# Patient Record
Sex: Male | Born: 2000 | Race: White | Hispanic: No | Marital: Single | Smoking: Never smoker
Health system: Southern US, Community
[De-identification: ages and names within clinical notes are randomized; demographics above are authoritative.]

---
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
## 2019-11-02 ENCOUNTER — Other Ambulatory Visit: Payer: Self-pay | Admitting: Neurological Surgery

## 2019-11-16 ENCOUNTER — Other Ambulatory Visit: Payer: Self-pay | Admitting: Neurological Surgery

## 2019-11-21 ENCOUNTER — Encounter (HOSPITAL_COMMUNITY)
Admission: RE | Admit: 2019-11-21 | Discharge: 2019-11-21 | Disposition: A | Discharge status: Home / Self Care | Payer: Medicaid Other | Source: Ambulatory Visit | Attending: Neurological Surgery | Admitting: Neurological Surgery

## 2019-11-21 ENCOUNTER — Other Ambulatory Visit: Payer: Self-pay | Admitting: Neurological Surgery

## 2019-11-21 ENCOUNTER — Other Ambulatory Visit: Payer: Self-pay

## 2019-11-21 ENCOUNTER — Encounter (HOSPITAL_COMMUNITY): Payer: Self-pay

## 2019-11-21 ENCOUNTER — Other Ambulatory Visit (HOSPITAL_COMMUNITY)
Admission: RE | Admit: 2019-11-21 | Discharge: 2019-11-21 | Disposition: A | Discharge status: Home / Self Care | Payer: Medicaid Other | Source: Ambulatory Visit | Attending: Neurological Surgery | Admitting: Neurological Surgery

## 2019-11-21 DIAGNOSIS — Z20822 Contact with and (suspected) exposure to covid-19: Secondary | ICD-10-CM | POA: Diagnosis not present

## 2019-11-21 DIAGNOSIS — Z01812 Encounter for preprocedural laboratory examination: Secondary | ICD-10-CM | POA: Insufficient documentation

## 2019-11-21 LAB — CBC
HCT: 47.2 % (ref 39.0–52.0)
Hemoglobin: 15.3 g/dL (ref 13.0–17.0)
MCH: 28 pg (ref 26.0–34.0)
MCHC: 32.4 g/dL (ref 30.0–36.0)
MCV: 86.3 fL (ref 80.0–100.0)
Platelets: 269 10*3/uL (ref 150–400)
RBC: 5.47 MIL/uL (ref 4.22–5.81)
RDW: 11.7 % (ref 11.5–15.5)
WBC: 5.3 10*3/uL (ref 4.0–10.5)
nRBC: 0 % (ref 0.0–0.2)

## 2019-11-21 LAB — SARS CORONAVIRUS 2 (TAT 6-24 HRS): SARS Coronavirus 2: NEGATIVE

## 2019-11-21 NOTE — Progress Notes (Signed)
[id number] [place], [place] - [address] AT [facility] [address] [place] [address] Phone: [phone] Fax: [phone]      Your procedure is scheduled on September 23  Report to [facility] Main Entrance "A" at 0530 A.M., and check in at the Admitting office.  Call this number if you have problems the morning of surgery:  [phone]  Call [phone] if you have any questions prior to your surgery date Monday-Friday 8am-4pm    Remember:  Do not eat or drink after midnight the night before your surgery    There are NO medications that you need to take the morning of surgery  As of today, STOP taking any Aspirin (unless otherwise instructed by your surgeon) Aleve, Naproxen, Ibuprofen, Motrin, Advil, Goody's, BC's, all herbal medications, fish oil, and all vitamins.                      Do not wear jewelry\            Do not wear lotions, powders, colognes, or deodorant.            \Men may shave face and neck.            Do not bring valuables to the hospital.            [facility] is not responsible for any belongings or valuables.  Do NOT Smoke (Tobacco/Vaping) or drink Alcohol 24 hours prior to your procedure If you use a CPAP at night, you may bring all equipment for your overnight stay.   Contacts, glasses, dentures or bridgework may not be worn into surgery.      For patients admitted to the hospital, discharge time will be determined by your treatment team.   Patients discharged the day of surgery will not be allowed to drive home, and someone needs to stay with them for 24 hours.    Special instructions:   [place]- Preparing For Surgery  Before surgery, you can play an important role. Because skin is not sterile, your skin needs to be as free of germs as possible. You can reduce the number of germs on your skin by washing with CHG (chlorahexidine gluconate) Soap before surgery.  CHG is an antiseptic cleaner which kills  germs and bonds with the skin to continue killing germs even after washing.    Oral Hygiene is also important to reduce your risk of infection.  Remember - BRUSH YOUR TEETH THE MORNING OF SURGERY WITH YOUR REGULAR TOOTHPASTE  Please do not use if you have an allergy to CHG or antibacterial soaps. If your skin becomes reddened/irritated stop using the CHG.  Do not shave (including legs and underarms) for at least 48 hours prior to first CHG shower. It is OK to shave your face.  Please follow these instructions carefully.   1. Shower the NIGHT BEFORE SURGERY and the MORNING OF SURGERY with CHG Soap.   2. If you chose to wash your hair, wash your hair first as usual with your normal shampoo.  3. After you shampoo, rinse your hair and body thoroughly to remove the shampoo.  4. Use CHG as you would any other liquid soap. You can apply CHG directly to the skin and wash gently with a scrungie or a clean washcloth.   5. Apply the CHG Soap to your body ONLY FROM THE NECK DOWN.  Do not use on open wounds or open sores. Avoid  contact with your eyes, ears, mouth and genitals (private parts). Wash Face and genitals (private parts)  with your normal soap.   6. Wash thoroughly, paying special attention to the area where your surgery will be performed.  7. Thoroughly rinse your body with warm water from the neck down.  8. DO NOT shower/wash with your normal soap after using and rinsing off the CHG Soap.  9. Pat yourself dry with a CLEAN TOWEL.  10. Wear CLEAN PAJAMAS to bed the night before surgery  11. Place CLEAN SHEETS on your bed the night of your first shower and DO NOT SLEEP WITH PETS.   Day of Surgery: Wear Clean/Comfortable clothing the morning of surgery Do not apply any deodorants/lotions.   Remember to brush your teeth WITH YOUR REGULAR TOOTHPASTE.   Please read over the following fact sheets that you were given.

## 2019-11-21 NOTE — Progress Notes (Signed)
PCP - Kaleen Mask   Chest x-ray - n/a EKG - n/a Stress Test - n/a ECHO - n/a Cardiac Cath - n/a  COVID TEST- 11/21/19   Anesthesia review: NO  Patient denies shortness of breath, fever, cough and chest pain at PAT appointment   All instructions explained to the patient, with a verbal understanding of the material. Patient agrees to go over the instructions while at home for a better understanding. Patient also instructed to self quarantine after being tested for COVID-19. The opportunity to ask questions was provided.

## 2019-11-23 LAB — NASOPHARYNGEAL CULTURE: Culture: NORMAL

## 2019-11-23 NOTE — Anesthesia Preprocedure Evaluation (Addendum)
Anesthesia Evaluation  Patient identified by MRN, date of birth, ID band Patient awake    Reviewed: Allergy & Precautions, H&P , NPO status , Patient's Chart, lab work & pertinent test results  Airway Mallampati: I  TM Distance: >3 FB Neck ROM: Full    Dental no notable dental hx. (+) Teeth Intact, Dental Advisory Given   Pulmonary  Patient states he vapes daily.    Pulmonary exam normal breath sounds clear to auscultation       Cardiovascular Exercise Tolerance: Good negative cardio ROS   Rhythm:Regular Rate:Normal     Neuro/Psych negative neurological ROS  negative psych ROS   GI/Hepatic negative GI ROS, Neg liver ROS,   Endo/Other  negative endocrine ROS  Renal/GU negative Renal ROS  negative genitourinary   Musculoskeletal   Abdominal   Peds  Hematology negative hematology ROS (+)   Anesthesia Other Findings   Reproductive/Obstetrics negative OB ROS                          Anesthesia Physical Anesthesia Plan  ASA: II  Anesthesia Plan: General   Post-op Pain Management:    Induction: Intravenous  PONV Risk Score and Plan: 3 and Ondansetron, Dexamethasone and Midazolam  Airway Management Planned: Oral ETT  Additional Equipment:   Intra-op Plan:   Post-operative Plan: Extubation in OR  Informed Consent: I have reviewed the patients History and Physical, chart, labs and discussed the procedure including the risks, benefits and alternatives for the proposed anesthesia with the patient or authorized representative who has indicated his/her understanding and acceptance.     Dental advisory given  Plan Discussed with: CRNA  Anesthesia Plan Comments:       Anesthesia Quick Evaluation

## 2019-11-24 ENCOUNTER — Encounter (HOSPITAL_COMMUNITY): Payer: Self-pay | Admitting: Neurological Surgery

## 2019-11-24 ENCOUNTER — Ambulatory Visit (HOSPITAL_COMMUNITY): Payer: Medicaid Other

## 2019-11-24 ENCOUNTER — Ambulatory Visit (HOSPITAL_COMMUNITY): Payer: Medicaid Other | Admitting: Anesthesiology

## 2019-11-24 ENCOUNTER — Encounter (HOSPITAL_COMMUNITY)
Admission: RE | Disposition: A | Discharge status: Home / Self Care | Payer: Self-pay | Source: Home / Self Care | Attending: Neurological Surgery

## 2019-11-24 ENCOUNTER — Ambulatory Visit (HOSPITAL_COMMUNITY)
Admission: RE | Admit: 2019-11-24 | Discharge: 2019-11-24 | Disposition: A | Discharge status: Home / Self Care | Payer: Medicaid Other | Attending: Neurological Surgery | Admitting: Neurological Surgery

## 2019-11-24 ENCOUNTER — Other Ambulatory Visit: Payer: Self-pay

## 2019-11-24 DIAGNOSIS — M5117 Intervertebral disc disorders with radiculopathy, lumbosacral region: Secondary | ICD-10-CM | POA: Insufficient documentation

## 2019-11-24 DIAGNOSIS — M5126 Other intervertebral disc displacement, lumbar region: Secondary | ICD-10-CM | POA: Diagnosis present

## 2019-11-24 DIAGNOSIS — Z87891 Personal history of nicotine dependence: Secondary | ICD-10-CM | POA: Insufficient documentation

## 2019-11-24 DIAGNOSIS — Z419 Encounter for procedure for purposes other than remedying health state, unspecified: Secondary | ICD-10-CM

## 2019-11-24 HISTORY — PX: LUMBAR LAMINECTOMY/ DECOMPRESSION WITH MET-RX: SHX5959

## 2019-11-24 LAB — PROTIME-INR
INR: 1 (ref 0.8–1.2)
Prothrombin Time: 12.8 seconds (ref 11.4–15.2)

## 2019-11-24 SURGERY — LUMBAR LAMINECTOMY/ DECOMPRESSION WITH MET-RX
Anesthesia: General | Site: Spine Lumbar

## 2019-11-24 MED ORDER — DEXAMETHASONE SODIUM PHOSPHATE 10 MG/ML IJ SOLN
INTRAMUSCULAR | Status: DC | PRN
  Administered 2019-11-24: 10 mg via INTRAVENOUS

## 2019-11-24 MED ORDER — THROMBIN 5000 UNITS EX SOLR
CUTANEOUS | Status: AC
  Filled 2019-11-24: qty 5000

## 2019-11-24 MED ORDER — HYDROCODONE-ACETAMINOPHEN 5-325 MG PO TABS
1.0000 | ORAL_TABLET | Freq: Four times a day (QID) | ORAL | Status: DC | PRN
Start: 2019-11-24 — End: 2019-11-24

## 2019-11-24 MED ORDER — CHLORHEXIDINE GLUCONATE 0.12 % MT SOLN
15.0000 mL | Freq: Once | OROMUCOSAL | Status: AC
  Administered 2019-11-24: 15 mL via OROMUCOSAL
  Filled 2019-11-24: qty 15

## 2019-11-24 MED ORDER — ACETAMINOPHEN 500 MG PO TABS
1000.0000 mg | ORAL_TABLET | Freq: Once | ORAL | Status: AC
  Administered 2019-11-24: 1000 mg via ORAL
  Filled 2019-11-24: qty 2

## 2019-11-24 MED ORDER — THROMBIN 5000 UNITS EX SOLR
OROMUCOSAL | Status: DC | PRN
  Administered 2019-11-24: 5 mL via TOPICAL

## 2019-11-24 MED ORDER — ROCURONIUM BROMIDE 10 MG/ML (PF) SYRINGE
PREFILLED_SYRINGE | INTRAVENOUS | Status: DC | PRN
  Administered 2019-11-24: 20 mg via INTRAVENOUS
  Administered 2019-11-24: 50 mg via INTRAVENOUS

## 2019-11-24 MED ORDER — CHLORHEXIDINE GLUCONATE CLOTH 2 % EX PADS: 6.0000 | MEDICATED_PAD | Freq: Once | CUTANEOUS | Status: DC

## 2019-11-24 MED ORDER — LIDOCAINE 2% (20 MG/ML) 5 ML SYRINGE
INTRAMUSCULAR | Status: AC
  Filled 2019-11-24: qty 5

## 2019-11-24 MED ORDER — BUPIVACAINE-EPINEPHRINE (PF) 0.5% -1:200000 IJ SOLN
INTRAMUSCULAR | Status: DC | PRN
  Administered 2019-11-24: 5 mL

## 2019-11-24 MED ORDER — PROPOFOL 10 MG/ML IV BOLUS
INTRAVENOUS | Status: DC | PRN
  Administered 2019-11-24: 150 mg via INTRAVENOUS

## 2019-11-24 MED ORDER — PHENYLEPHRINE 40 MCG/ML (10ML) SYRINGE FOR IV PUSH (FOR BLOOD PRESSURE SUPPORT)
PREFILLED_SYRINGE | INTRAVENOUS | Status: DC | PRN
  Administered 2019-11-24: 80 ug via INTRAVENOUS

## 2019-11-24 MED ORDER — ONDANSETRON HCL 4 MG/2ML IJ SOLN
INTRAMUSCULAR | Status: DC | PRN
  Administered 2019-11-24: 4 mg via INTRAVENOUS

## 2019-11-24 MED ORDER — FENTANYL CITRATE (PF) 250 MCG/5ML IJ SOLN
INTRAMUSCULAR | Status: AC
  Filled 2019-11-24: qty 5

## 2019-11-24 MED ORDER — HYDROMORPHONE HCL 1 MG/ML IJ SOLN: 0.2500 mg | INTRAMUSCULAR | Status: DC | PRN

## 2019-11-24 MED ORDER — DEXMEDETOMIDINE (PRECEDEX) IN NS 20 MCG/5ML (4 MCG/ML) IV SYRINGE
PREFILLED_SYRINGE | INTRAVENOUS | Status: AC
  Filled 2019-11-24: qty 5

## 2019-11-24 MED ORDER — MIDAZOLAM HCL 2 MG/2ML IJ SOLN
INTRAMUSCULAR | Status: AC
  Filled 2019-11-24: qty 2

## 2019-11-24 MED ORDER — LIDOCAINE-EPINEPHRINE 1 %-1:100000 IJ SOLN
INTRAMUSCULAR | Status: AC
  Filled 2019-11-24: qty 1

## 2019-11-24 MED ORDER — 0.9 % SODIUM CHLORIDE (POUR BTL) OPTIME
TOPICAL | Status: DC | PRN
  Administered 2019-11-24: 1000 mL

## 2019-11-24 MED ORDER — LACTATED RINGERS IV SOLN: INTRAVENOUS | Status: DC

## 2019-11-24 MED ORDER — FENTANYL CITRATE (PF) 100 MCG/2ML IJ SOLN
INTRAMUSCULAR | Status: DC | PRN
Start: 2019-11-24 — End: 2019-11-24
  Administered 2019-11-24: 100 ug via INTRAVENOUS
  Administered 2019-11-24: 50 ug via INTRAVENOUS

## 2019-11-24 MED ORDER — SUGAMMADEX SODIUM 200 MG/2ML IV SOLN
INTRAVENOUS | Status: DC | PRN
  Administered 2019-11-24: 200 mg via INTRAVENOUS

## 2019-11-24 MED ORDER — ROCURONIUM BROMIDE 10 MG/ML (PF) SYRINGE
PREFILLED_SYRINGE | INTRAVENOUS | Status: AC
  Filled 2019-11-24: qty 10

## 2019-11-24 MED ORDER — HYDROCODONE-ACETAMINOPHEN 5-325 MG PO TABS
1.0000 | ORAL_TABLET | Freq: Four times a day (QID) | ORAL | 0 refills | Status: AC | PRN
Start: 2019-11-24 — End: 2020-11-23

## 2019-11-24 MED ORDER — METHOCARBAMOL 500 MG PO TABS: 500.0000 mg | ORAL_TABLET | Freq: Three times a day (TID) | ORAL | Status: DC | PRN

## 2019-11-24 MED ORDER — ALBUTEROL SULFATE HFA 108 (90 BASE) MCG/ACT IN AERS
INHALATION_SPRAY | RESPIRATORY_TRACT | Status: DC | PRN
  Administered 2019-11-24: 5 via RESPIRATORY_TRACT

## 2019-11-24 MED ORDER — ORAL CARE MOUTH RINSE: 15.0000 mL | Freq: Once | OROMUCOSAL | Status: AC

## 2019-11-24 MED ORDER — LIDOCAINE 2% (20 MG/ML) 5 ML SYRINGE
INTRAMUSCULAR | Status: DC | PRN
  Administered 2019-11-24: 60 mg via INTRAVENOUS

## 2019-11-24 MED ORDER — PROPOFOL 10 MG/ML IV BOLUS
INTRAVENOUS | Status: AC
  Filled 2019-11-24: qty 20

## 2019-11-24 MED ORDER — BUPIVACAINE-EPINEPHRINE 0.5% -1:200000 IJ SOLN
INTRAMUSCULAR | Status: AC
  Filled 2019-11-24: qty 1

## 2019-11-24 MED ORDER — ONDANSETRON HCL 4 MG/2ML IJ SOLN
INTRAMUSCULAR | Status: AC
  Filled 2019-11-24: qty 2

## 2019-11-24 MED ORDER — CEFAZOLIN SODIUM-DEXTROSE 2-4 GM/100ML-% IV SOLN
2.0000 g | INTRAVENOUS | Status: AC
  Administered 2019-11-24: 2 g via INTRAVENOUS
  Filled 2019-11-24: qty 100

## 2019-11-24 MED ORDER — PHENYLEPHRINE 40 MCG/ML (10ML) SYRINGE FOR IV PUSH (FOR BLOOD PRESSURE SUPPORT)
PREFILLED_SYRINGE | INTRAVENOUS | Status: AC
  Filled 2019-11-24: qty 10

## 2019-11-24 MED ORDER — DEXMEDETOMIDINE (PRECEDEX) IN NS 20 MCG/5ML (4 MCG/ML) IV SYRINGE
PREFILLED_SYRINGE | INTRAVENOUS | Status: DC | PRN
  Administered 2019-11-24 (×2): 8 ug via INTRAVENOUS
  Administered 2019-11-24: 4 ug via INTRAVENOUS

## 2019-11-24 MED ORDER — DEXAMETHASONE SODIUM PHOSPHATE 10 MG/ML IJ SOLN
INTRAMUSCULAR | Status: AC
  Filled 2019-11-24: qty 1

## 2019-11-24 MED ORDER — LIDOCAINE-EPINEPHRINE 1 %-1:100000 IJ SOLN
INTRAMUSCULAR | Status: DC | PRN
  Administered 2019-11-24: 5 mL

## 2019-11-24 MED ORDER — METHOCARBAMOL 500 MG PO TABS: 500.0000 mg | ORAL_TABLET | Freq: Three times a day (TID) | ORAL | 1 refills | Status: DC | PRN

## 2019-11-24 MED ORDER — MIDAZOLAM HCL 5 MG/5ML IJ SOLN
INTRAMUSCULAR | Status: DC | PRN
  Administered 2019-11-24: 2 mg via INTRAVENOUS

## 2019-11-24 SURGICAL SUPPLY — 66 items
ADH SKN CLS APL DERMABOND .7 (GAUZE/BANDAGES/DRESSINGS) ×1
BAND INSRT 18 STRL LF DISP RB (MISCELLANEOUS) ×2
BAND RUBBER #18 3X1/16 STRL (MISCELLANEOUS) ×6 IMPLANT
BLADE SURG 11 STRL SS (BLADE) ×3 IMPLANT
BUR MATCHSTICK NEURO 3.0 LAGG (BURR) ×3 IMPLANT
CARTRIDGE OIL MAESTRO DRILL (MISCELLANEOUS) ×1 IMPLANT
CNTNR URN SCR LID CUP LEK RST (MISCELLANEOUS) ×1 IMPLANT
CONT SPEC 4OZ STRL OR WHT (MISCELLANEOUS) ×3
COVER BACK TABLE 60X90IN (DRAPES) ×3 IMPLANT
COVER MAYO STAND STRL (DRAPES) ×3 IMPLANT
COVER WAND RF STERILE (DRAPES) ×3 IMPLANT
DECANTER SPIKE VIAL GLASS SM (MISCELLANEOUS) ×3 IMPLANT
DERMABOND ADVANCED (GAUZE/BANDAGES/DRESSINGS) ×2
DERMABOND ADVANCED .7 DNX12 (GAUZE/BANDAGES/DRESSINGS) IMPLANT
DIFFUSER DRILL AIR PNEUMATIC (MISCELLANEOUS) ×3 IMPLANT
DRAPE C-ARM 42X72 X-RAY (DRAPES) ×3 IMPLANT
DRAPE LAPAROTOMY 100X72X124 (DRAPES) ×3 IMPLANT
DRAPE MICROSCOPE LEICA (MISCELLANEOUS) ×3 IMPLANT
DRAPE SURG 17X23 STRL (DRAPES) ×3 IMPLANT
DRSG OPSITE POSTOP 3X4 (GAUZE/BANDAGES/DRESSINGS) ×2 IMPLANT
DURAPREP 26ML APPLICATOR (WOUND CARE) ×3 IMPLANT
ELECT BLADE INSULATED 6.5IN (ELECTROSURGICAL) ×3
ELECT REM PT RETURN 9FT ADLT (ELECTROSURGICAL) ×3
ELECTRODE BLDE INSULATED 6.5IN (ELECTROSURGICAL) ×1 IMPLANT
ELECTRODE REM PT RTRN 9FT ADLT (ELECTROSURGICAL) ×1 IMPLANT
GAUZE 4X4 16PLY RFD (DISPOSABLE) IMPLANT
GAUZE SPONGE 4X4 12PLY STRL (GAUZE/BANDAGES/DRESSINGS) ×1 IMPLANT
GLOVE BIO SURGEON STRL SZ 6.5 (GLOVE) ×5 IMPLANT
GLOVE BIO SURGEONS STRL SZ 6.5 (GLOVE) ×5
GLOVE BIOGEL PI IND STRL 6.5 (GLOVE) IMPLANT
GLOVE BIOGEL PI IND STRL 8 (GLOVE) ×2 IMPLANT
GLOVE BIOGEL PI INDICATOR 6.5 (GLOVE) ×4
GLOVE BIOGEL PI INDICATOR 8 (GLOVE) ×2
GLOVE ECLIPSE 8.0 STRL XLNG CF (GLOVE) ×4 IMPLANT
GOWN STRL REUS W/ TWL LRG LVL3 (GOWN DISPOSABLE) IMPLANT
GOWN STRL REUS W/ TWL XL LVL3 (GOWN DISPOSABLE) ×1 IMPLANT
GOWN STRL REUS W/TWL 2XL LVL3 (GOWN DISPOSABLE) IMPLANT
GOWN STRL REUS W/TWL LRG LVL3 (GOWN DISPOSABLE) ×6
GOWN STRL REUS W/TWL XL LVL3 (GOWN DISPOSABLE) ×3
HEMOSTAT POWDER KIT SURGIFOAM (HEMOSTASIS) ×3 IMPLANT
KIT BASIN OR (CUSTOM PROCEDURE TRAY) ×3 IMPLANT
KIT POSITION SURG JACKSON T1 (MISCELLANEOUS) ×3 IMPLANT
KIT TURNOVER KIT B (KITS) ×3 IMPLANT
MARKER SKIN DUAL TIP RULER LAB (MISCELLANEOUS) ×3 IMPLANT
NDL HYPO 25X1 1.5 SAFETY (NEEDLE) ×1 IMPLANT
NDL SPNL 18GX3.5 QUINCKE PK (NEEDLE) ×1 IMPLANT
NEEDLE HYPO 25X1 1.5 SAFETY (NEEDLE) ×3 IMPLANT
NEEDLE SPNL 18GX3.5 QUINCKE PK (NEEDLE) ×3 IMPLANT
NS IRRIG 1000ML POUR BTL (IV SOLUTION) ×3 IMPLANT
OIL CARTRIDGE MAESTRO DRILL (MISCELLANEOUS) ×3
PACK LAMINECTOMY NEURO (CUSTOM PROCEDURE TRAY) ×3 IMPLANT
PAD ARMBOARD 7.5X6 YLW CONV (MISCELLANEOUS) ×9 IMPLANT
PATTIES SURGICAL .5 X.5 (GAUZE/BANDAGES/DRESSINGS) IMPLANT
PATTIES SURGICAL .5 X1 (DISPOSABLE) IMPLANT
PATTIES SURGICAL 1X1 (DISPOSABLE) IMPLANT
SPONGE LAP 4X18 RFD (DISPOSABLE) IMPLANT
STAPLER VISISTAT 35W (STAPLE) IMPLANT
SUT VIC AB 0 CT1 27 (SUTURE) ×3
SUT VIC AB 0 CT1 27XBRD ANBCTR (SUTURE) ×1 IMPLANT
SUT VIC AB 2-0 CP2 18 (SUTURE) ×3 IMPLANT
SUT VIC AB 3-0 SH 8-18 (SUTURE) IMPLANT
SYR BULB IRRIG 60ML STRL (SYRINGE) ×2 IMPLANT
TOWEL GREEN STERILE (TOWEL DISPOSABLE) ×2 IMPLANT
TOWEL GREEN STERILE FF (TOWEL DISPOSABLE) ×2 IMPLANT
TRAY FOLEY MTR SLVR 16FR STAT (SET/KITS/TRAYS/PACK) IMPLANT
WATER STERILE IRR 1000ML POUR (IV SOLUTION) ×3 IMPLANT
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
## 2019-11-24 NOTE — Progress Notes (Signed)
   Providing Compassionate, Quality Care - Together  NEUROSURGERY PROGRESS NOTE   S: s/e in pacu. Denies leg pain whatsoever  O: EXAM:  BP (!) 110/49   Pulse 79   Temp (!) 97.2 F (36.2 C)   Resp (!) 28   Ht 5\' 11"  (1.803 m)   Wt 76.2 kg   SpO2 99%   BMI 23.43 kg/m   Awake, alert, oriented  Speech fluent, appropriate  CNs grossly intact  5/5 BUE/BLE  Incision c/d/i, dressing in place Neg SLR on left  ASSESSMENT:  19 y.o. male with  1.  Left S1 radiculopathy due to left L5-S1 herniated nucleus pulposus  -Status post left L5-S1 microdiscectomy on 11/24/2019  PLAN: -DC home -Light activity -Wound care -Instructions given to the patient and his mother -Follow-up in 2 weeks    Thank you for allowing me to participate in this patient's care.  Please do not hesitate to call with questions or concerns.   11/26/2019, DO Neurosurgeon [facility] Cell: [phone]

## 2019-11-24 NOTE — Anesthesia Postprocedure Evaluation (Signed)
Anesthesia Post Note  Patient: Eric Tucker  Procedure(s) Performed: LUMBAR LAMINECTOMY/ DECOMPRESSION WITH MET-RX LUMBAR FIVE-SACRAL ONE (N/A Spine Lumbar)     Patient location during evaluation: PACU Anesthesia Type: General Level of consciousness: awake and alert Pain management: pain level controlled Vital Signs Assessment: post-procedure vital signs reviewed and stable Respiratory status: spontaneous breathing, nonlabored ventilation and respiratory function stable Cardiovascular status: blood pressure returned to baseline and stable Postop Assessment: no apparent nausea or vomiting Anesthetic complications: no   No complications documented.  Last Vitals:  Vitals:   11/24/19 1030 11/24/19 1045  BP: (!) 117/58 116/84  Pulse: 85 67  Resp: 20 20  Temp:    SpO2: 98% 100%    Last Pain:  Vitals:   11/24/19 1030  TempSrc:   PainSc: 0-No pain                 Pearlene Teat,W. EDMOND

## 2019-11-24 NOTE — Anesthesia Procedure Notes (Signed)
Procedure Name: Intubation Date/Time: 11/24/2019 7:41 AM Performed by: Lovie Chol, CRNA Pre-anesthesia Checklist: Patient identified, Emergency Drugs available, Suction available and Patient being monitored Patient Re-evaluated:Patient Re-evaluated prior to induction Oxygen Delivery Method: Circle System Utilized Preoxygenation: Pre-oxygenation with 100% oxygen Induction Type: IV induction Ventilation: Mask ventilation without difficulty Laryngoscope Size: Miller and 3 Grade View: Grade I Tube type: Oral Tube size: 7.5 mm Number of attempts: 1 Airway Equipment and Method: Stylet and Oral airway Placement Confirmation: ETT inserted through vocal cords under direct vision,  positive ETCO2 and breath sounds checked- equal and bilateral Secured at: 22 cm Tube secured with: Tape Dental Injury: Teeth and Oropharynx as per pre-operative assessment

## 2019-11-24 NOTE — Transfer of Care (Signed)
Immediate Anesthesia Transfer of Care Note  Patient: Eric Tucker  Procedure(s) Performed: LUMBAR LAMINECTOMY/ DECOMPRESSION WITH MET-RX LUMBAR FIVE-SACRAL ONE (N/A Spine Lumbar)  Patient Location: PACU  Anesthesia Type:General  Level of Consciousness: oriented, sedated and patient cooperative  Airway & Oxygen Therapy: Patient Spontanous Breathing and Patient connected to nasal cannula oxygen  Post-op Assessment: Report given to RN and Post -op Vital signs reviewed and stable  Post vital signs: Reviewed  Last Vitals:  Vitals Value Taken Time  BP 112/52 11/24/19 1008  Temp 36.2 C 11/24/19 1008  Pulse 80 11/24/19 1009  Resp 24 11/24/19 1009  SpO2 100 % 11/24/19 1009  Vitals shown include unvalidated device data.  Last Pain:  Vitals:   11/24/19 0611  TempSrc:   PainSc: 6          Complications: No complications documented.
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
## 2019-11-25 ENCOUNTER — Encounter (HOSPITAL_COMMUNITY): Payer: Self-pay | Admitting: Neurological Surgery

## 2021-04-22 ENCOUNTER — Other Ambulatory Visit (HOSPITAL_BASED_OUTPATIENT_CLINIC_OR_DEPARTMENT_OTHER): Payer: Self-pay

## 2021-04-22 ENCOUNTER — Other Ambulatory Visit: Payer: Self-pay

## 2021-04-22 ENCOUNTER — Encounter (HOSPITAL_BASED_OUTPATIENT_CLINIC_OR_DEPARTMENT_OTHER): Payer: Self-pay

## 2021-04-22 ENCOUNTER — Emergency Department (HOSPITAL_BASED_OUTPATIENT_CLINIC_OR_DEPARTMENT_OTHER): Payer: Medicaid Other | Admitting: Radiology

## 2021-04-22 ENCOUNTER — Emergency Department (HOSPITAL_BASED_OUTPATIENT_CLINIC_OR_DEPARTMENT_OTHER)
Admission: EM | Admit: 2021-04-22 | Discharge: 2021-04-22 | Disposition: A | Discharge status: Home / Self Care | Payer: Medicaid Other | Attending: Emergency Medicine | Admitting: Emergency Medicine

## 2021-04-22 DIAGNOSIS — M545 Low back pain, unspecified: Secondary | ICD-10-CM | POA: Insufficient documentation

## 2021-04-22 MED ORDER — METHOCARBAMOL 500 MG PO TABS
500.0000 mg | ORAL_TABLET | Freq: Two times a day (BID) | ORAL | 0 refills | Status: AC
  Filled 2021-04-22: qty 20, 10d supply, fill #0

## 2021-04-22 MED ORDER — LIDOCAINE 5 % EX PTCH
1.0000 | MEDICATED_PATCH | CUTANEOUS | 0 refills | Status: AC
  Filled 2021-04-22: qty 30, 30d supply, fill #0
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
## 2021-04-22 NOTE — Discharge Instructions (Addendum)
Take the medications as prescribed  Follow-up with Dr. Jake Samples if your symptoms do not improve  Return for new or worsening symptoms

## 2021-04-22 NOTE — ED Triage Notes (Signed)
Lower back pain onset one week.  Denies injury  Back surgery in September.  Ambulatory to room

## 2021-06-12 ENCOUNTER — Emergency Department (HOSPITAL_BASED_OUTPATIENT_CLINIC_OR_DEPARTMENT_OTHER)
Admission: EM | Admit: 2021-06-12 | Discharge: 2021-06-12 | Disposition: A | Discharge status: Home / Self Care | Payer: No Typology Code available for payment source | Attending: Emergency Medicine | Admitting: Emergency Medicine

## 2021-06-12 ENCOUNTER — Encounter (HOSPITAL_BASED_OUTPATIENT_CLINIC_OR_DEPARTMENT_OTHER): Payer: Self-pay

## 2021-06-12 ENCOUNTER — Other Ambulatory Visit: Payer: Self-pay

## 2021-06-12 DIAGNOSIS — S0191XA Laceration without foreign body of unspecified part of head, initial encounter: Secondary | ICD-10-CM | POA: Diagnosis not present

## 2021-06-12 DIAGNOSIS — Y990 Civilian activity done for income or pay: Secondary | ICD-10-CM | POA: Diagnosis not present

## 2021-06-12 DIAGNOSIS — S0990XA Unspecified injury of head, initial encounter: Secondary | ICD-10-CM | POA: Diagnosis present

## 2021-06-12 MED ORDER — BACITRACIN ZINC 500 UNIT/GM EX OINT
TOPICAL_OINTMENT | Freq: Once | CUTANEOUS | Status: AC
  Administered 2021-06-12: 1 via TOPICAL
  Filled 2021-06-12: qty 28.35

## 2021-06-12 MED ORDER — BACITRACIN ZINC 500 UNIT/GM EX OINT: 1.0000 "application " | TOPICAL_OINTMENT | Freq: Two times a day (BID) | CUTANEOUS | 0 refills | Status: AC
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
## 2021-06-12 NOTE — ED Triage Notes (Signed)
He states he was inadvertently struck by a hammer at work today. He has a small lac. At left upper forehead area not far from midline. At present is is not bleeding and is completely approximated. He denies l.o.c. and is in no distress. ?

## 2021-06-12 NOTE — ED Notes (Signed)
Pt denis N/V, states that he feels ok ?

## 2021-06-12 NOTE — ED Provider Notes (Signed)
?[facility] EMERGENCY DEPT ?Provider Note ? ? ?CSN: [id number] ?Arrival date & time: 06/12/21  1141 ? ?  ? ?History ? ?Chief Complaint  ?Patient presents with  ? Head Laceration  ? ? ?Eric Tucker is a 21 y.o. male with chief complaint of a head laceration after being struck by a hammer at work today.  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  His coworker was directly above him and the hammer dropped on his head about 4 to 6 inches from above.  Denies fall, syncope, or loss of consciousness.  Denies nausea or vomiting.  Denies vision changes, dizziness, lightheadedness.  Denies any other injury.  Not on anticoagulation.  Has had Tdap within the last 10 years. ? ?The history is provided by the patient and medical records.  ?Head Laceration ? ? ?  ? ?Home Medications ?Prior to Admission medications   ?Medication Sig Start Date End Date Taking? Authorizing Provider  ?bacitracin ointment Apply 1 application. topically 2 (two) times daily. 06/12/21  Yes Cecil Cobbs, PA-C  ?lidocaine (LIDODERM) 5 % Place 1 patch onto the skin daily. Remove & Discard patch within 12 hours or as directed by MD 04/22/21   Henderly, Britni A, PA-C  ?methocarbamol (ROBAXIN) 500 MG tablet Take 1 tablet (500 mg total) by mouth 2 (two) times daily. 04/22/21   Henderly, Britni A, PA-C  ?   ? ?Allergies    ?Patient has no known allergies.   ? ?Review of Systems   ?Review of Systems  ?Skin:  Positive for wound.  ? ?Physical Exam ?Updated Vital Signs ?BP 112/64   Pulse (!) 56   Temp 97.9 ?F (36.6 ?C)   Resp 16   SpO2 99%  ?Physical Exam ?Vitals and nursing note reviewed.  ?Constitutional:   ?   General: He is not in acute distress. ?   Appearance: Normal appearance. He is well-developed. He is not ill-appearing or diaphoretic.  ?HENT:  ?   Head: Normocephalic.  ? ?   Comments: Laceration of 1-1.5 cm with minimal active bleeding ?Bottom of wound visualized ?No foreign body or debris appreciated ?Clean  borders ?   Nose: Nose normal.  ?   Mouth/Throat:  ?   Mouth: Mucous membranes are moist.  ?   Pharynx: Oropharynx is clear.  ?Eyes:  ?   General: No scleral icterus. ?   Conjunctiva/sclera: Conjunctivae normal.  ?Cardiovascular:  ?   Rate and Rhythm: Normal rate and regular rhythm.  ?   Pulses: Normal pulses.  ?   Heart sounds: Normal heart sounds. No murmur heard. ?Pulmonary:  ?   Effort: Pulmonary effort is normal. No respiratory distress.  ?   Breath sounds: Normal breath sounds.  ?Abdominal:  ?   Palpations: Abdomen is soft.  ?   Tenderness: There is no abdominal tenderness.  ?Musculoskeletal:     ?   General: No swelling.  ?   Cervical back: Neck supple.  ?Skin: ?   General: Skin is warm and dry.  ?   Capillary Refill: Capillary refill takes less than 2 seconds.  ?Neurological:  ?   Mental Status: He is alert and oriented to person, place, and time.  ?Psychiatric:     ?   Mood and Affect: Mood normal.  ? ? ?ED Results / Procedures / Treatments   ?Labs ?(all labs ordered are listed, but only abnormal results are displayed) ?Labs Reviewed - No data to display ? ?EKG ?None ? ?  Radiology ?No results found. ? ?Procedures ?Procedures  ? ? ?Medications Ordered in ED ?Medications  ?bacitracin ointment (has no administration in time range)  ? ? ?ED Course/ Medical Decision Making/ A&P ?  ?                        ?Medical Decision Making ?Amount and/or Complexity of Data Reviewed ?External Data Reviewed: notes. ?Labs:  Decision-making details documented in ED Course. ?Radiology:  Decision-making details documented in ED Course. ?ECG/medicine tests:  Decision-making details documented in ED Course. ? ?Risk ?OTC drugs. ?Prescription drug management. ? ? ?21 y.o. male presents to the ED for concern of Head Laceration ? Marland Kitchen  This involves an extensive number of treatment options, and is a complaint that carries with it a high risk of complications and morbidity.  The differential diagnosis includes laceration, abrasion,  contusion, concussion, intracranial hemorrhage ? ?Additional history obtained from internal/external records available via epic ? ?Intervention: ?I ordered medication including bacitracin for antibiotic treatment.  Reevaluation of the patient after these medicines showed that the patient tolerated this well.  I have reviewed the patients home medicines and have made adjustments as needed ? ?ED Course: ?Pressure irrigation performed. Wound explored and base of wound visualized in a bloodless field without evidence of foreign body.  Laceration occurred < 8 hours prior to treatment which was well tolerated.  Has had Tdap within the last 10 years.  Pt has no comorbidities to effect normal wound healing.  Topical antibiotics applied in ED, and also discharged with prescription for topical antibiotics.  Considered sutures or staples, but wound is 1cm in length in the scalp and pt refuses these options, as they would not allow him to return to work.  Pt's wound location also not ideal for use of surgical glue.  Pt to follow-up for wound check in 7 days ? ?Emergency department workup does not suggest an emergent condition requiring admission or immediate intervention beyond  what has been performed at this time.  The patient is safe for discharge and has been instructed to return immediately for worsening symptoms, change in symptoms or any other concerns ? ?Disposition: ?I discussed the patient and their case with my attending, Dr. Deretha Emory, who agreed with the proposed treatment course.  After consideration of the diagnostic results and the patient's response to treatment, I feel that the patient would benefit from outpatient pain management and follow-up for wound reassessment.  Discussed course of treatment thoroughly with the patient and he demonstrated understanding.  Patient in agreement and has no further questions. ? ? ?This chart was dictated using voice recognition software.  Despite best efforts to proofread,   errors can occur which can change the documentation meaning. ? ? ? ? ? ? ? ?Final Clinical Impression(s) / ED Diagnoses ?Final diagnoses:  ?Laceration of head without foreign body, unspecified part of head, initial encounter  ? ? ?Rx / DC Orders ?ED Discharge Orders   ? ?      Ordered  ?  bacitracin ointment  2 times daily       ? 06/12/21 1354  ? ?  ?  ? ?  ? ? ?  ?Cecil Cobbs, PA-C ?06/12/21 1408 ? ?  ?Vanetta Mulders, MD ?06/13/21 [phone] ? ?

## 2021-08-28 ENCOUNTER — Other Ambulatory Visit (HOSPITAL_COMMUNITY): Payer: Self-pay

## 2022-05-07 DIAGNOSIS — M5136 Other intervertebral disc degeneration, lumbar region: Secondary | ICD-10-CM | POA: Diagnosis not present

## 2022-05-07 DIAGNOSIS — M5417 Radiculopathy, lumbosacral region: Secondary | ICD-10-CM | POA: Diagnosis not present

## 2022-05-07 DIAGNOSIS — Z9889 Other specified postprocedural states: Secondary | ICD-10-CM | POA: Diagnosis not present

## 2022-06-11 IMAGING — DX DG LUMBAR SPINE COMPLETE 4+V
5 series · 5 of 5 positions shown · non-contrast
Comparison: 11/24/2019, CT 10/13/2016

CLINICAL DATA: Low back pain

EXAM:
LUMBAR SPINE - COMPLETE 4+ VIEW

[l-spine ap]
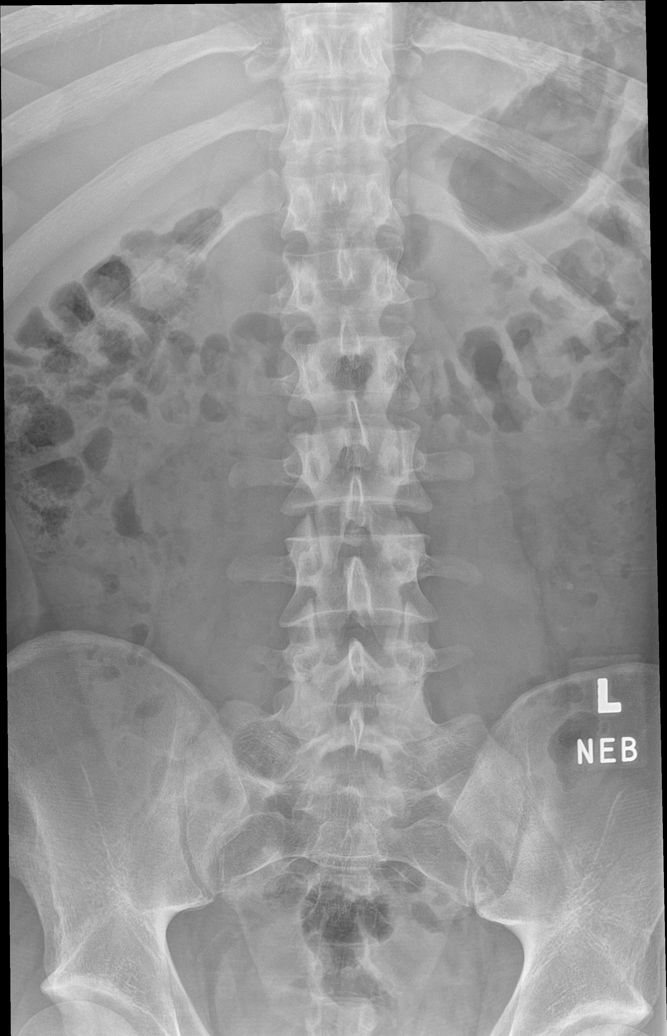

[l-spine obl (1 of 2)]
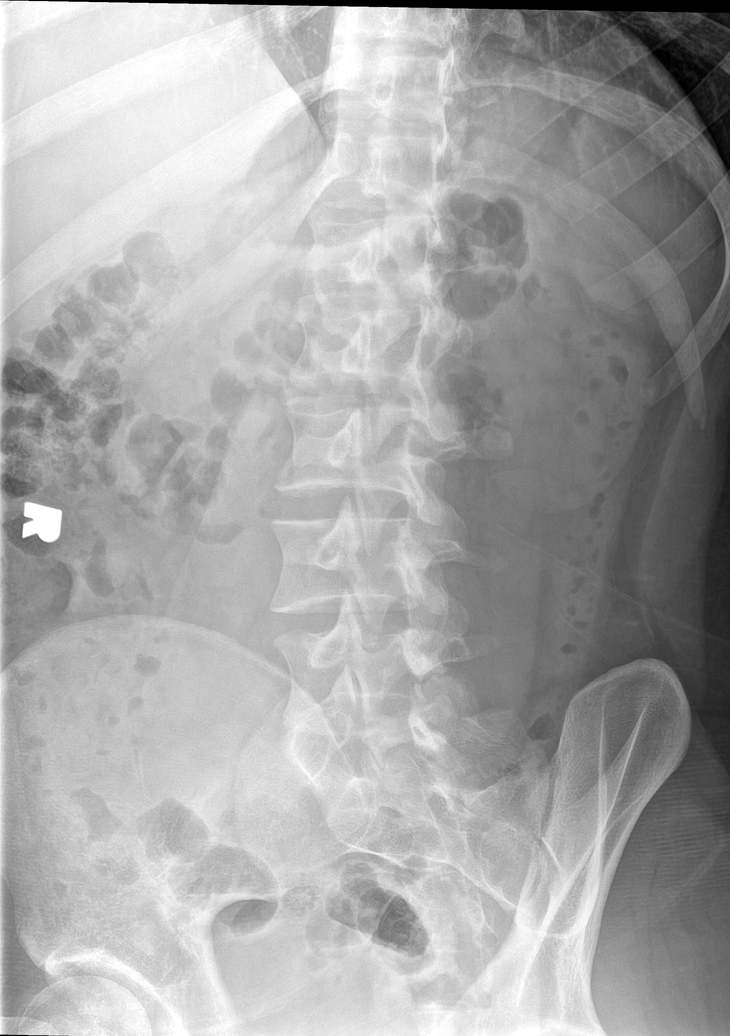

[l-spine obl (2 of 2)]
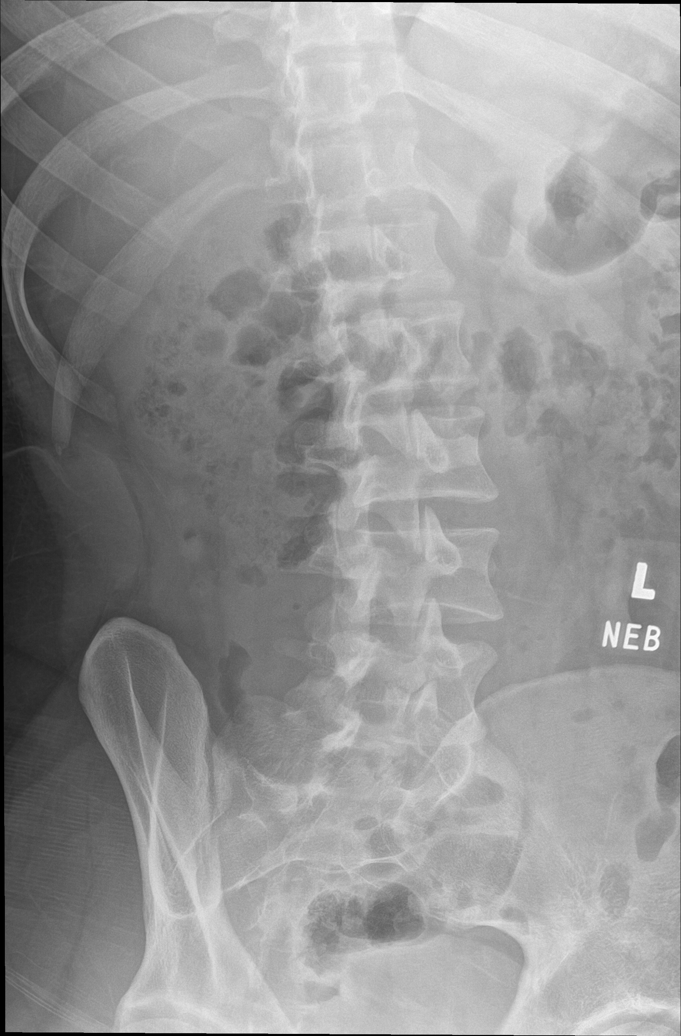

[l-spine lat]
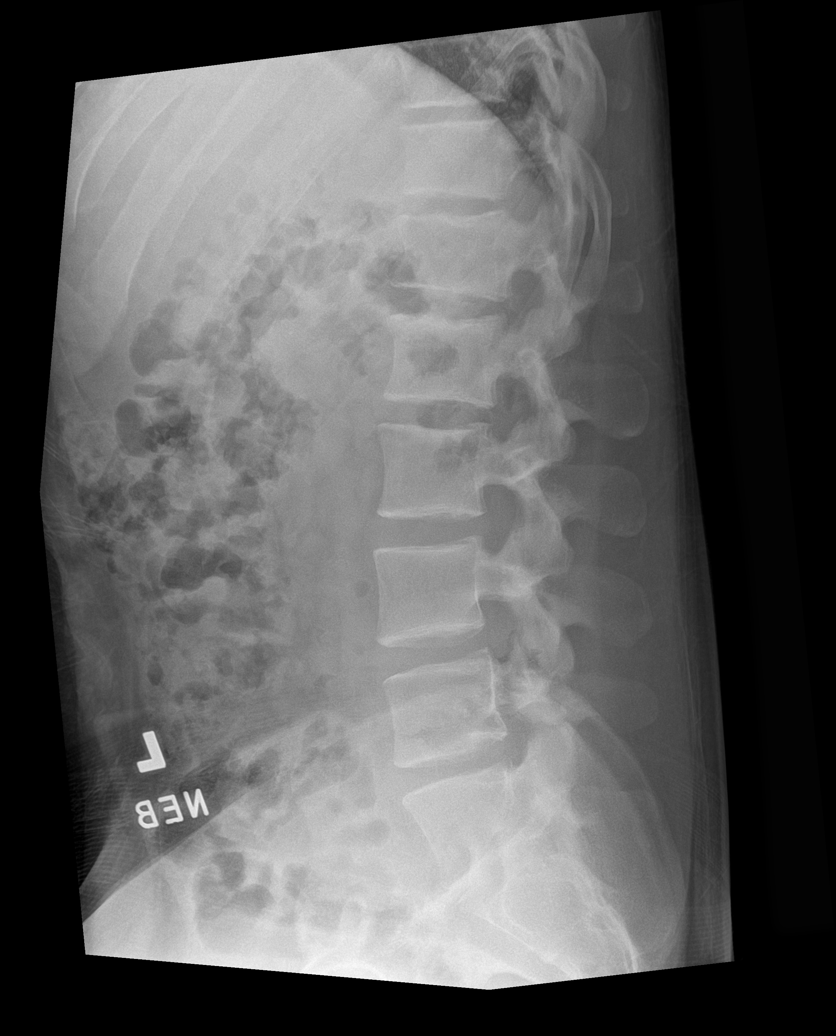

[l-spine spot]
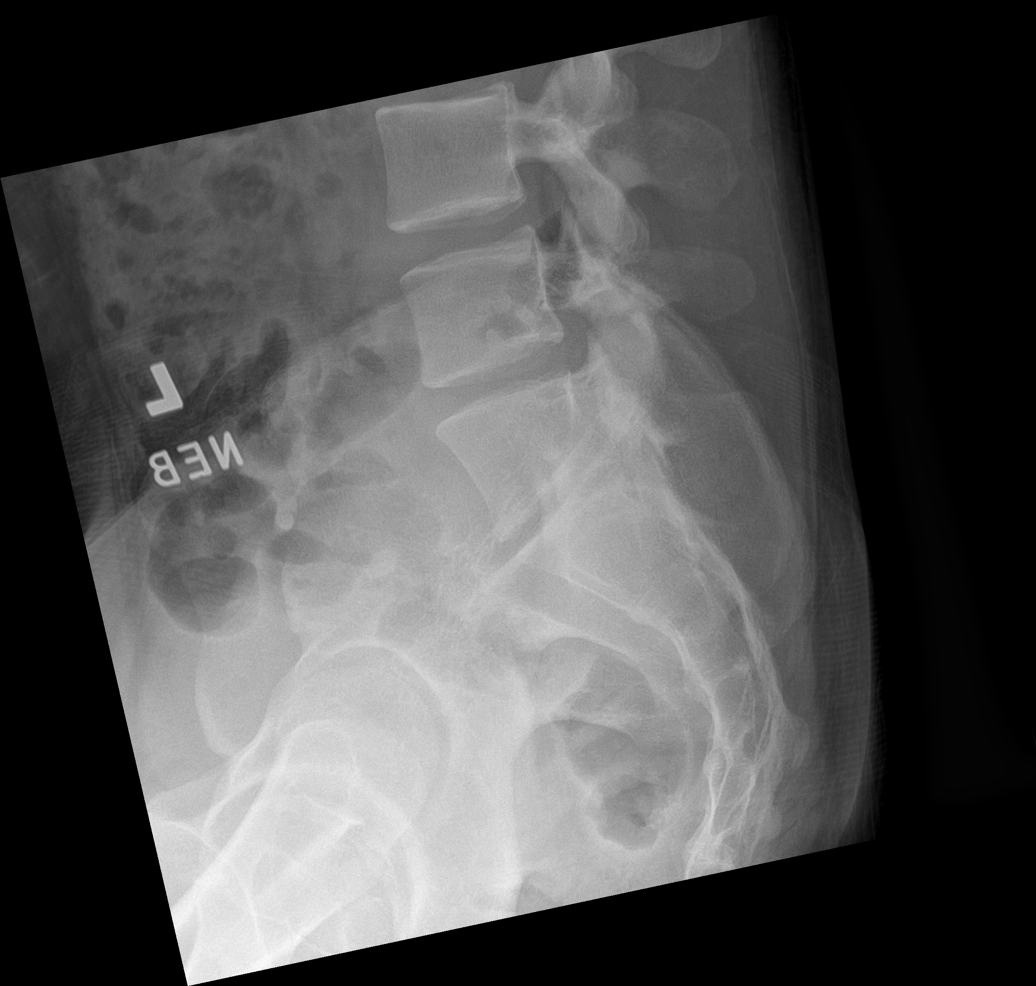

[5 of 5 positions shown; findings below may reference images not displayed]

FINDINGS: There are 11 rib pairs. For the purposes of reporting, hypoplastic
ribs are assumed at T12. Lumbar alignment is normal. Vertebral body
heights are maintained. Possible disc space narrowing at L5-S1.
Remaining disc spaces appear patent.
IMPRESSION: Probable mild disc space narrowing at L5-S1. No acute osseous
abnormality

## 2023-05-20 ENCOUNTER — Encounter (HOSPITAL_BASED_OUTPATIENT_CLINIC_OR_DEPARTMENT_OTHER): Payer: Self-pay

## 2023-05-20 ENCOUNTER — Emergency Department (HOSPITAL_BASED_OUTPATIENT_CLINIC_OR_DEPARTMENT_OTHER)
Admission: EM | Admit: 2023-05-20 | Discharge: 2023-05-20 | Disposition: A | Discharge status: Home / Self Care | Attending: Student | Admitting: Student

## 2023-05-20 ENCOUNTER — Other Ambulatory Visit: Payer: Self-pay

## 2023-05-20 ENCOUNTER — Emergency Department (HOSPITAL_BASED_OUTPATIENT_CLINIC_OR_DEPARTMENT_OTHER): Admitting: Radiology

## 2023-05-20 DIAGNOSIS — S8392XA Sprain of unspecified site of left knee, initial encounter: Secondary | ICD-10-CM | POA: Diagnosis not present

## 2023-05-20 DIAGNOSIS — Y9355 Activity, bike riding: Secondary | ICD-10-CM | POA: Insufficient documentation

## 2023-05-20 DIAGNOSIS — M25562 Pain in left knee: Secondary | ICD-10-CM | POA: Diagnosis present

## 2023-05-20 MED ORDER — NAPROXEN 500 MG PO TABS: 500.0000 mg | ORAL_TABLET | Freq: Two times a day (BID) | ORAL | 0 refills | Status: AC
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
## 2023-05-20 NOTE — Discharge Instructions (Signed)
 You have been seen today for your complaint of left knee pain. Your imaging showed no fractures. Your discharge medications include Naprosyn.  This is an NSAID pain medication.  Take this up to twice daily.  You may take Tylenol take this medication as well. Home care instructions are as follows:  Rest, ice, elevate the leg, wear the knee immobilizer until you follow-up with orthopedics Follow up with: Dr. Ave Filter.  He is an Investment banker, operational.  Call to schedule follow-up appointment.  You may also call any orthopedic provider he would like to see Please seek immediate medical care if you develop any of the following symptoms: You cannot use your injured knee to support any of your body weight (cannot bear weight). You cannot move the injured joint. You cannot walk more than a few steps without pain or without your knee buckling. You have a lot of pain, swelling, or numbness in the leg below the cast, brace, or splint. Your foot or toes are numb, cold, or blue after you loosen your splint or brace. At this time there does not appear to be the presence of an emergent medical condition, however there is always the potential for conditions to change. Please read and follow the below instructions.  Do not take your medicine if  develop an itchy rash, swelling in your mouth or lips, or difficulty breathing; call 911 and seek immediate emergency medical attention if this occurs.  You may review your lab tests and imaging results in their entirety on your MyChart account.  Please discuss all results of fully with your primary care provider and other specialist at your follow-up visit.  Note: Portions of this text may have been transcribed using voice recognition software. Every effort was made to ensure accuracy; however, inadvertent computerized transcription errors may still be present.
